# Patient Record
Sex: Female | Born: 1965 | Race: White | Hispanic: No | Marital: Married | Smoking: Never smoker
Health system: Southern US, Community
[De-identification: ages and names within clinical notes are randomized; demographics above are authoritative.]

---

## 2007-07-06 NOTE — H&P (Signed)
[facility]                              HISTORY AND PHYSICAL                             Morgandale W. Garlan Rush, [name]   NAMECHABLIS, LOSH [facility]   MR #:    [id number]                  ADM DATE:          07/10/2007   BILLING  [id number]                 PT. LOCATION   #:   SS #     [id number]               DOB:  1965/11/03   AGE:  41   Sandra Rush, [name]             SEX:  F   cc:    [name], [name]   CHIEF COMPLAINT:    Enlarged uterus.  Pelvic mass.   HISTORY OF THE PRESENT ILLNESS:  This patient is a 41 year old gravida 3,   para 2-0-1-2 woman whose last menstrual period was approximately 07/05/07.   She is self-referred with an enlarged uterus and a pelvic mass.  She   presented with heavy menses.  She was found to have a mass on abdominal   examination.  This was confirmed on transvaginal ultrasound which was   performed on 06/12/07.  This revealed the uterus to measure 11 x 7 x 8 cm.   Adjacent to the right ovary is a large heterogeneous solid mass measuring 6   x 9 cm.  This mass appears to be contiguous with the uterus.  A pelvic MRI   on 06/21/07 revealed the left ovary to measure 3.4 x 3 x 1.9 cm with a 2.1   x 1.9 cm cyst.  The right ovary measured 2 x 2.3 x 3.7 cm.  The uterus   measured 12.8 x 15.7 x 7.5 cm and contained multiple fibroids.  The largest   mass was noted to be exophytic measuring 7 x 9 x 7.3 cm.  This appeared to   be attached to the uterus by a 4.9 cm attachment.  The mass was noted to be   heterogenous and thought to be consistent with cystic degeneration.   PAST MEDICAL HISTORY:  The patient denies significant medical problems,   including cardiovascular, respiratory, renal, hepatic or endocrine   disorders.  She does give a history of abnormal Pap smears.  Her most   recent Pap smear in August 2008 was negative.   PAST SURGICAL HISTORY:  Cesarean section x 2.   CURRENT MEDICATIONS:   Vitamins and herbs.   ALLERGIES:    None known.    FAMILY HISTORY:  Significant for cardiovascular disease, diabetes and   hypertension.  Family history is negative for cancer.   SOCIAL HISTORY:  The patient is married and lives in [place], [place]   [place].  She works for [employer], an Scientist, forensic.  She does not smoke.   REVIEW OF SYSTEMS:  She has occasional nocturia.  She wears a pad for   occasional urinary incontinence. She has occasional constipation.   Otherwise, review of systems is noncontributory.  PHYSICAL EXAMINATION:   HEENT:  Grossly normal.   NECK:  Supple with normal thyroid.   LYMPH NODES:  No supraclavicular, axillary or inguinal adenopathy.   CHEST:  Clear to auscultation.   CARDIOVASCULAR:  Normal sinus rhythm.   ABDOMEN:  There is a mass in the lower quadrants of the abdomen extending   higher on the right side, nearly to the level of the umbilicus.  The mass   appears to be firm.   PELVIC:  Normal external genitalia.  The vagina is without obvious lesions.   The cervix is nulliparous.  Bimanual and rectovaginal examination reveals a   16 to 18-week size mass in the pelvis extending superiorly, more on the   right than the left.  No cul-de-sac nodularity.   EXTREMITIES:  No edema.   NEUROLOGIC:  No focal defects.   ASSESSMENT:   1. Enlarged uterus with multiple leiomyomata.   2. 6 x 9 cm heterogenous right pelvic mass.  The differential diagnosis      includes a degenerating uterine fibroid, broad ligament tumor or ovarian      tumor.   3. Status post cesarean section x 2.   PLAN:  Exploratory laparotomy with total abdominal hysterectomy and   resection of the right pelvic mass.  The patient wishes to retain her   ovaries if possible.  She understands that should she have a malignancy of   the uterus or ovaries, the ovaries would need to be resected.  In addition,   she understands the potential risks and complications of surgery to include   the possibility of bleeding, infection, blood clots, damage to normal    organs, medical or anesthesia complications, wound complications and even   death.  Furthermore, she understands she may require more treatment should   she have a malignancy.   Electronically Signed By:   Acie Fredrickson. Garlan Rush, [name] 07/20/2007 09:10   ____________________________   Acie Fredrickson. Garlan Rush, [name]   Marton Redwood  D:  07/06/2007  T:  07/06/2007  8:38 A   [id number]

## 2007-07-10 NOTE — Op Note (Signed)
[facility]                                OPERATION REPORT                        SURGEON:  Sandra Fredrickson. Garlan Fair, [name]   Hector Brunswick, [facility]   E:   MR  [id number]                DATE OF SURGERY:   #:   SS  [id number]             PT. LOCATION:                        [id number]   #   Sandra Fredrickson. Garlan Fair, [name]       DOB: September 15, 1966        AGE:41        SEX:  F   cc:    Sandra Rush, [name]   PREOPERATIVE DIAGNOSIS:   Complex pelvic mass with elevated serum CA-125.  Rule out ovarian cancer.   PREOPERATIVE DIAGNOSIS:   Uterine leiomyomata with degenerating leiomyoma.   OPERATIVE PROCEDURES:   1. Exploratory laparotomy.   2. Total abdominal hysterectomy.   SURGEON:   Jodi Geralds, [name]   ANESTHESIA:   General.   INDICATION FOR SURGERY:   The patient is a 41 year old referred with a large complex pelvic mass and   an elevated serum CA-125.  She desired her ovaries to be retained should   there be no evidence of malignancy.  She and her husband understood   preoperatively that in the face of abnormal ovaries, there is a 1% to 2%   chance of ovarian cancer in the future and 7% to 10% chance of requiring   surgery for ovarian problems in the future.  She decided to retain her   ovaries should they be within normal limits.  See dictated history and   physical.   OPERATIVE FINDINGS:   Bloody ascites, 150 mL.  Upper abdominal exploration within normal limits.   Gallbladder slightly distended.  Multiple leiomyomata.  Ovaries and tubes   grossly normal.  Intraoperative assessment by pathology revealed multiple   leiomyomata.  The largest leiomyoma was consistent with a degenerating   leiomyoma.  There was no evidence of malignancy.   OPERATIVE PROCEDURE:  Dr. Jani Gravel performed abdominoplasty under the   same anesthesia.  He will dictate his portion of the surgery separately,   which will include incising the skin and subcutaneous tissues and closure   of the skin and subcutaneous tissues.   The fascia was incised from  the symphysis to the umbilicus.  This incision   was extended to the left and superiorly for approximately 3 cm to 4 cm.   The peritoneal cavity was entered sharply.  Approximately 150 mL of bloody   ascites was encountered and removed.  This was discarded once the diagnosis   of malignancy was excluded.   A Gibson-Balfour retractor was placed.  The upper abdomen was explored with   the above findings.  Exposure was achieved using large Deavers held by   [employer].   The uterus was grasped to each cornual surface with a large Pean.  The left   round ligament was suture ligated and divided with cautery.  The posterior   peritoneum  was incised with cautery and the retroperitoneal space was   opened with blunt dissection.  The ureter was identified.  The uteroovarian   ligament was then doubly clamped, divided, and ligated with 0 Vicryl ties.   On the right, the right round ligament was suture ligated and divided with   cautery.  The posterior peritoneum overlying the psoas muscles was incised   and the retroperitoneal space opened with blunt dissection.  The ureter was   identified.  The uteroovarian ligament was doubly clamped, divided, and   ligated with 0 Vicryl ties.  A second pedicle was created in the same   fashion.  It should be noted that both ovaries and tubes were grossly   within normal limits.   The anterior leaf of the broad ligament was incised and the bladder   dissected from the lower uterine segment, cervix, and upper vagina.  The   serial pedicles were then created bilaterally incorporating the uterine   vessels, cardinal ligaments, uterosacral ligaments, paracervical, and   paravaginal tissues.  These pedicles were created with Heaney clamps.  The   pedicles were divided and ligated with 0 Vicryl sutures.  The specimen   including the cervix and uterus was resected from the upper vagina and   given to pathology for intraoperative evaluation.  Pathology assessment was   that of no evidence of  malignancy.  The large leiomyoma extending from the   right lateral fundal area was consistent with a degenerating leiomyoma.   Vaginal angle sutures were placed in a figure-of-eight fashion bilaterally   with 0 Vicryl and held for traction.  The vagina was closed with   interrupted figure-of-eight sutures of 0 Vicryl.  The pelvis and abdomen   were then irrigated with copious amounts of warm water and hemostasis   assured.   EndoSheath was placed to cover the posterior peritoneal incisions in the   pelvis bilaterally in attempts to prevent future adhesions of the adnexa.   The abdomen was then closed with a running #1 loop PDS suture.   Dr. Henrene Hawking will dictate closure of the skin in his abdominoplasty operative   report.   The patient tolerated the procedure well and returned to the recovery room   in excellent condition.   ESTIMATED BLOOD LOSS:   Less than 100 mL.   COMPLICATIONS:   None.   Sponge, needle, and instrument counts were correct x3.   Electronically Signed By:   Sandra Fredrickson. Garlan Fair, [name] 07/20/2007 09:10   _________________________________   Sandra Rush, [name]   TS1  D:  07/10/2007  T:  07/11/2007  9:57 A   [id number]

## 2007-07-10 NOTE — Op Note (Signed)
[facility]                                OPERATION REPORT                         SURGEON:  Laneta Simmers, [name]   [facility] Sandra Rush, Sandra Rush   E:   MR  [id number]                DATE OF SURGERY:                     07/10/2007   #:   Sandra Rush  [id number]             PT. LOCATION:                        [id number]   #   Laneta Simmers, [name]         DOB: [birth year]        AGE:41        SEX:  F   cc:    Sandra Fredrickson. MONTAG, [name]          Laneta Simmers, [name]   PREOPERATIVE DIAGNOSIS:   Abdominal panniculus   POSTOPERATIVE DIAGNOSIS:   Abdominal panniculus   PROCEDURE:   Abdominoplasty with liposuction of the abdomen   SURGEON:   Dr. Dimas Millin   ASSISTANT:   Arline Asp   ANESTHESIA:   General   DRAINS:   Two J-vacs   COMPLICATIONS:   None   INDICATIONS:   The patient is a 41 year old female with an abdominal panniculus and   multiple abdominal striae.  She has midabdominal lipodystrophy and also a   history of uterine fibroids requiring  hysterectomy.  Plans were made for   simultaneous abdominal hysterectomy by Dr. Garlan Fair and abdominoplasty.   DESCRIPTION OF PROCEDURE:  The patient was taken to the operating room and   placed in the supine position.  After general anesthesia was begun, she was   prepped and draped in a sterile manner.  A hip-to-hip incision was made   just below her Pfannenstiel scar in the pubic area with a 10 scalpel.  The   abdomen was infiltrated with approximately 700 cc of Klein solution.   Liposuction of the central portion of the abdomen was performed with a   triple-lumen 4-mm cannula, and approximately 400 cc of fatty material was   removed.   A skin flap was then elevated from the pubic incision to the costal margins   and xiphoid centrally.  Because of a lack of superior abdominal excessive   skin and a high-placed umbilicus, plans were made for floating the   umbilicus.  This was performed by transcut through the base of the   umbilicus at the abdominal wall which was later to be reinserted.  An    opening was made in the base of the umbilicus which was repaired with 3-0   Monocryl subcutaneous suture and 6-0 nylon skin suture inferiorly.   The hysterectomy was performed by Dr. Garlan Fair, and subsequently the rectus   fascia was re-plicated in the midline with a running 0 Ethibond locked   suture from the upper third of the abdomen to the pubic area.  Once this   was secured, the patient was placed in a flexed position and the excess  skin flap excised with a 10 scalpel and the cutting cautery.   Reapproximation of the umbilicus to the midline was performed with   interrupted 3-0 Monocryl suture.  The flap was plicated in the midline with   3-0 Monocryl suture and 4-0 nylon suture.  Two #10 flat J-vac drains were   introduced laterally.  The right one was placed above the umbilicus and the   left below.  Both were held in place with interrupted 4-0 nylon sutures.   After the excess skin fat flap was excised and hemostasis performed with   electrocautery, subcutaneous reapproximation of the flap to the lower   incision line was performed with interrupted 3-0 Monocryl sutures and a   running 3-0 Prolene subcuticular suture.  The wound was dressed with   benzoin, Steri-Strips, Xeroform, 4 x 4s, ABD and abdominal binder.   Estimated blood loss was 100 cc for the abdominoplasty.  Specimen was not   sent to pathology as it was normal skin and fat.  The patient tolerated the   procedure well and was taken to the recovery room in satisfactory   condition.   Electronically Signed By:   Laneta Simmers, [name] 07/18/2007 08:27   _________________________________   Laneta Simmers, [name]   [facility]  D:  07/10/2007  T:  07/11/2007  9:58 A   [id number]

## 2007-07-10 NOTE — Op Note (Signed)
[facility]                                OPERATION REPORT                        SURGEON:  Acie Fredrickson. Garlan Fair, [name]   Hector Brunswick, [facility]   E:   MR  [id number]                DATE OF SURGERY:   #:   SS  [id number]             PT. LOCATION:                        [id number]   #   Acie Fredrickson. Garlan Fair, [name]       DOB: 26-Apr-1966        AGE:41        SEX:  F   cc:    [name], [name]   PREOPERATIVE DIAGNOSIS:   Complex pelvic mass with elevated serum CA-125.  Rule out ovarian cancer.   PREOPERATIVE DIAGNOSIS:   Uterine leiomyomata with degenerating leiomyoma.   OPERATIVE PROCEDURES:   1. Exploratory laparotomy.   2. Total abdominal hysterectomy.   SURGEON:   Jodi Geralds, [name]   ANESTHESIA:   General.   INDICATION FOR SURGERY:   The patient is a 41 year old referred with a large complex pelvic mass and   an elevated serum CA-125.  She desired her ovaries to be retained should   there be no evidence of malignancy.  She and her husband understood   preoperatively that in the face of abnormal ovaries, there is a 1% to 2%   chance of ovarian cancer in the future and 7% to 10% chance of requiring   surgery for ovarian problems in the future.  She decided to retain her   ovaries should they be within normal limits.  See dictated history and   physical.   OPERATIVE FINDINGS:   Bloody ascites, 150 mL.  Upper abdominal exploration within normal limits.   Gallbladder slightly distended.  Multiple leiomyomata.  Ovaries and tubes   grossly normal.  Intraoperative assessment by pathology revealed multiple   leiomyomata.  The largest leiomyoma was consistent with a degenerating   leiomyoma.  There was no evidence of malignancy.   OPERATIVE PROCEDURE:  Dr. Jani Gravel performed abdominoplasty under the   same anesthesia.  He will dictate his portion of the surgery separately,   which will include incising the skin and subcutaneous tissues and closure   of the skin and subcutaneous tissues.    The fascia was incised from the symphysis to the umbilicus.  This incision   was extended to the left and superiorly for approximately 3 cm to 4 cm.   The peritoneal cavity was entered sharply.  Approximately 150 mL of bloody   ascites was encountered and removed.  This was discarded once the diagnosis   of malignancy was excluded.   A Gibson-Balfour retractor was placed.  The upper abdomen was explored with   the above findings.  Exposure was achieved using large Deavers held by   [employer].   The uterus was grasped to each cornual surface with a large Pean.  The left   round ligament was suture ligated and divided with cautery.  The posterior   peritoneum  was incised with cautery and the retroperitoneal space was   opened with blunt dissection.  The ureter was identified.  The uteroovarian   ligament was then doubly clamped, divided, and ligated with 0 Vicryl ties.   On the right, the right round ligament was suture ligated and divided with   cautery.  The posterior peritoneum overlying the psoas muscles was incised   and the retroperitoneal space opened with blunt dissection.  The ureter was   identified.  The uteroovarian ligament was doubly clamped, divided, and   ligated with 0 Vicryl ties.  A second pedicle was created in the same   fashion.  It should be noted that both ovaries and tubes were grossly   within normal limits.   The anterior leaf of the broad ligament was incised and the bladder   dissected from the lower uterine segment, cervix, and upper vagina.  The   serial pedicles were then created bilaterally incorporating the uterine   vessels, cardinal ligaments, uterosacral ligaments, paracervical, and   paravaginal tissues.  These pedicles were created with Heaney clamps.  The   pedicles were divided and ligated with 0 Vicryl sutures.  The specimen   including the cervix and uterus was resected from the upper vagina and   given to pathology for intraoperative evaluation.  Pathology assessment was    that of no evidence of malignancy.  The large leiomyoma extending from the   right lateral fundal area was consistent with a degenerating leiomyoma.   Vaginal angle sutures were placed in a figure-of-eight fashion bilaterally   with 0 Vicryl and held for traction.  The vagina was closed with   interrupted figure-of-eight sutures of 0 Vicryl.  The pelvis and abdomen   were then irrigated with copious amounts of warm water and hemostasis   assured.   EndoSheath was placed to cover the posterior peritoneal incisions in the   pelvis bilaterally in attempts to prevent future adhesions of the adnexa.   The abdomen was then closed with a running #1 loop PDS suture.   Dr. Henrene Hawking will dictate closure of the skin in his abdominoplasty operative   report.   The patient tolerated the procedure well and returned to the recovery room   in excellent condition.   ESTIMATED BLOOD LOSS:   Less than 100 mL.   COMPLICATIONS:   None.   Sponge, needle, and instrument counts were correct x3.   Electronically Signed By:   Acie Fredrickson. Garlan Fair, [name] 07/20/2007 09:10   _________________________________   [name], [name]   TS1  D:  07/10/2007  T:  07/11/2007  9:57 A   [id number]

## 2007-07-10 NOTE — Op Note (Signed)
[facility]                                OPERATION REPORT                         SURGEON:  FRED H. SIEGEL, [name]   Sandra Rush, Sebrina   E:   MR  [id number]                DATE OF SURGERY:                     07/10/2007   #:   SS  [id number]             PT. LOCATION:                        [id number]   #   FRED H. SIEGEL, [name]         DOB: 01/09/1966        AGE:41        SEX:  F   cc:    THOMAS W. MONTAG, [name]          FRED H. SIEGEL, [name]   PREOPERATIVE DIAGNOSIS:   Abdominal panniculus   POSTOPERATIVE DIAGNOSIS:   Abdominal panniculus   PROCEDURE:   Abdominoplasty with liposuction of the abdomen   SURGEON:   Dr. F. Siegel   ASSISTANT:   Cindy   ANESTHESIA:   General   DRAINS:   Two J-vacs   COMPLICATIONS:   None   INDICATIONS:   The patient is a 41-year-old female with an abdominal panniculus and   multiple abdominal striae.  She has midabdominal lipodystrophy and also a   history of uterine fibroids requiring  hysterectomy.  Plans were made for   simultaneous abdominal hysterectomy by Dr. Montag and abdominoplasty.   DESCRIPTION OF PROCEDURE:  The patient was taken to the operating room and   placed in the supine position.  After general anesthesia was begun, she was   prepped and draped in a sterile manner.  A hip-to-hip incision was made   just below her Pfannenstiel scar in the pubic area with a 10 scalpel.  The   abdomen was infiltrated with approximately 700 cc of Klein solution.   Liposuction of the central portion of the abdomen was performed with a   triple-lumen 4-mm cannula, and approximately 400 cc of fatty material was   removed.   A skin flap was then elevated from the pubic incision to the costal margins   and xiphoid centrally.  Because of a lack of superior abdominal excessive   skin and a high-placed umbilicus, plans were made for floating the   umbilicus.  This was performed by transcut through the base of the   umbilicus at the abdominal wall which was later to be reinserted.  An    opening was made in the base of the umbilicus which was repaired with 3-0   Monocryl subcutaneous suture and 6-0 nylon skin suture inferiorly.   The hysterectomy was performed by Dr. Montag, and subsequently the rectus   fascia was re-plicated in the midline with a running 0 Ethibond locked   suture from the upper third of the abdomen to the pubic area.  Once this   was secured, the patient was placed in a flexed position and the excess     skin flap excised with a 10 scalpel and the cutting cautery.   Reapproximation of the umbilicus to the midline was performed with   interrupted 3-0 Monocryl suture.  The flap was plicated in the midline with   3-0 Monocryl suture and 4-0 nylon suture.  Two #10 flat J-vac drains were   introduced laterally.  The right one was placed above the umbilicus and the   left below.  Both were held in place with interrupted 4-0 nylon sutures.   After the excess skin fat flap was excised and hemostasis performed with   electrocautery, subcutaneous reapproximation of the flap to the lower   incision line was performed with interrupted 3-0 Monocryl sutures and a   running 3-0 Prolene subcuticular suture.  The wound was dressed with   benzoin, Steri-Strips, Xeroform, 4 x 4s, ABD and abdominal binder.   Estimated blood loss was 100 cc for the abdominoplasty.  Specimen was not   sent to pathology as it was normal skin and fat.  The patient tolerated the   procedure well and was taken to the recovery room in satisfactory   condition.   Electronically Signed By:   FRED H. SIEGEL, [name] 07/18/2007 08:27   _________________________________   FRED H. SIEGEL, [name]   [facility]  D:  07/10/2007  T:  07/11/2007  9:58 A   [id number]

## 2007-07-15 NOTE — Discharge Summary (Signed)
[facility]                                DISCHARGE SUMMARY   Sandra Rush, Sandra Rush   MR #:  [id number]                      ADM DATE:   07/10/2007   BILLIN [id number]                     DIS DATE:     07/15/2007   G#   SS #   [id number]                   DOB:  1965-12-21   Sandra Rush, [name]               AGE:41                   SEX:  F   cc:    Sandra Rush, [name]   ADMISSION DIAGNOSIS:      1. Enlarged uterus.      2. Pelvic mass.   DISCHARGE DIAGNOSIS:  Uterine leiomyomata.   OPERATIVE PROCEDURES: 07/10/2007:      1. Exploratory laparotomy.      2. Total abdominal hysterectomy.      3. Abdominoplasty (Dr. Henrene Rush).   PATHOLOGY:      1. Uterine leiomyomata.      2. Large leiomyomata with increased mitotic activity.  No atypia or      increased cellularity.   INDICATIONS FOR ADMISSION AND SURGERY:  The patient is a 41 year old   gravida 3, para 2012 woman self referred with an enlarged uterus and a   pelvic mass.  She presented with heavy menses.  She was found to have a   mass on abdominal examination.  This was confirmed on transvaginal   ultrasound.  See dictated history and physical for further details.   HOSPITAL COURSE:  The patient underwent the above surgical procedures on   07/10/2007 under general anesthesia without complications.   Intraoperatively, 150 cc of bloody ascitic fluid was removed.  The uterus   was enlarged.  Intraoperative pathology assessment was consistent with a   degenerating leiomyoma.  Pathology as noted above.   POSTOPERATIVE COURSE:  The patient's postoperative course was uncomplicated   with the exception of a mild ileus.  Once bowel function returned, her diet   was advanced.  At the time of discharge she had normal bowel function and   was tolerating a regular diet.   DISPOSITION:  The patient will return to the office in 2 weeks for a follow   up.   DISCHARGE DIET:  Regular.   DISCHARGE ACTIVITY:  Ad- lib.   DISCHARGE MEDICATIONS:       1. Oxycodone 5 mg p.o. q.4 hours p.r.n.      2. She will resume the vitamins and herbal medication she was taking      prior to admission.   Electronically Signed By:   Sandra Rush, [name] 07/20/2007 09:10   ________________________________   Sandra Rush, [name]   TS1  D:  07/15/2007  T:  07/15/2007  1:23 P   [id number]

## 2015-01-31 ENCOUNTER — Ambulatory Visit
Admit: 2015-01-31 | Disposition: A | Discharge status: Home / Self Care | Payer: Self-pay | Attending: Family Medicine | Admitting: Family Medicine

## 2015-01-31 LAB — URINALYSIS, COMPLETE
Bilirubin,UR: NEGATIVE
Blood: NEGATIVE
Glucose,UR: 500
Nitrite: NEGATIVE
PH: 5.5 (ref 5.0–8.0)
Protein: 30
Specific Gravity: 1.025 (ref 1.000–1.030)
WBC UR: >30 /HPF (ref 0–5)

## 2015-01-31 LAB — GC/CHLAMYDIA PROBE AMP

## 2015-01-31 LAB — WET PREP, GENITAL

## 2015-02-02 LAB — URINE CULTURE

## 2015-02-11 ENCOUNTER — Ambulatory Visit: Admit: 2015-02-11 | Disposition: A | Discharge status: Home / Self Care | Payer: Self-pay

## 2015-06-13 DIAGNOSIS — S40869A Insect bite (nonvenomous) of unspecified upper arm, initial encounter: Secondary | ICD-10-CM

## 2015-06-13 NOTE — ED Provider Notes (Signed)
[facility]  EMERGENCY DEPARTMENT TREATMENT REPORT  NAME:  Sandra Rush, Sandra Rush  SEX:   F  ADMIT: 06/13/2015  DOB:   10-Dec-1965  MR#    [id number]  ROOM:  ER09  TIME DICTATED: 12 14 AM  ACCT#  [id number]        DATE OF SERVICE:   06/13/2015.     CHIEF COMPLAINT:  Insect bite.    HISTORY OF PRESENT ILLNESS:  This is a 49 year old female who thinks maybe she was bit by bedbugs.  She has   multiple insect bites over her body which are itchy.  She has no fevers, no   swelling, no difficulty breathing, no nausea or vomiting.    REVIEW OF SYSTEMS:  CONSTITUTIONAL:  No fever, chills.  EYES:   No visual symptoms.   ENT:  No sore throat, runny nose, or other URI symptoms.   HEMATOLOGIC/LYMPHATIC:   No excessive bruising or lymph node swelling.   ALLERGIC/IMMUNOLOGIC:  She has a rash over her body which is itchy.  RESPIRATORY:  No cough, shortness of breath, or wheezing.   CARDIOVASCULAR:  No chest pain, chest pressure, or palpitations.   GASTROINTESTINAL:  No vomiting, diarrhea, or abdominal pain.   MUSCULOSKELETAL:  No joint pain or swelling.   INTEGUMENTARY:  She has a rash but no other lesions.  NEUROLOGICAL:  No headaches.    PAST MEDICAL HISTORY:  Negative.    PAST SURGICAL HISTORY:  Fibroids.    PSYCHIATRIC HISTORY:  Anxiety.    SOCIAL HISTORY:  She denies alcohol, tobacco or illicit drug use.    CURRENT MEDICATIONS:   None.    ALLERGIES:  GLUTEN, WHEAT.    PHYSICAL EXAMINATION:  VITAL SIGNS:  Blood pressure 190/95, pulse 77, respirations 16, temperature   98.7.  GENERAL APPEARANCE:  Patient appears well developed and well nourished.    Appearance and behavior are age and situation appropriate.  She is well   appearing in no distress.  HEENT:  Head is atraumatic normocephalic.  Eyes:  Conjunctivae clear, lids   normal.  Pupils equal, symmetrical, and normally reactive.  Ears/Nose:    Hearing is grossly intact to voice.  Internal and external examinations of the    ears and nose are unremarkable.  Mouth/Throat:  Surfaces of the pharynx,   palate, and tongue are pink, moist, and without lesions.  There is no lip or   tongue swelling.  Oropharynx is unremarkable.  Her uvula is midline without   any swelling.  NECK:  Supple, nontender.  There is no stridor.   LYMPHATICS:  No cervical or submandibular lymphadenopathy palpated.   RESPIRATORY:  Clear and equal breath sounds.  No respiratory distress,   tachypnea, or accessory muscle use. No wheezing.  CARDIOVASCULAR:   Heart regular, without murmurs, gallops, rubs, or thrills.    SKIN:  She has a diffuse rash on her trunk and arms which appears to be   consistent with multiple insect bites.   MUSCULOSKELETAL: Stance and gait appear normal.   NEUROLOGIC:  Alert, oriented.  Sensation intact, motor strength equal and   symmetric.     INITIAL ASSESSMENT:  Multiple insect bites.  At this time there is no evidence of any significant   anaphylaxis.  She does seem to be having mild allergic reaction.  I am   treating her symptomatically and discharging her home with appropriate   outpatient management.    CLINICAL IMPRESSION:  1.  Multiple insect bites.  2.  Allergic  reaction.    DISPOSITION:     The patient is discharged with verbal and written instructions and a referral   for ongoing care.  The patient is aware that they may return at any time for   new or worsening symptoms.      ___________________  Johny Drillingodd A Cia Garretson MD  Dictated By: Marland Kitchen.     My signature above authenticates this document and my orders, the final   diagnosis (es), discharge prescription (s), and instructions in the Epic   record.  If you have any questions please contact [phone].    Nursing notes have been reviewed by the physician/ advanced practice   clinician.    DS  D:06/17/2015 00:14:43  T: 06/17/2015 01:51:19  [id number]

## 2015-06-13 NOTE — Other (Signed)
10:31 PM  06/13/15     Discharge instructions given to Sandra Rush  (name) with verbalization of understanding. Patient accompanied by self.  Patient discharged with the following prescriptions prednisone, atarax. Epi pen  Patient discharged to home (destination).      Driscilla Grammes, RN

## 2015-06-13 NOTE — ED Triage Notes (Signed)
Multiple areas of insect bites all over body

## 2015-06-14 ENCOUNTER — Inpatient Hospital Stay
Admit: 2015-06-14 | Discharge: 2015-06-14 | Disposition: A | Discharge status: Home / Self Care | Payer: PRIVATE HEALTH INSURANCE | Attending: Emergency Medicine

## 2015-06-14 MED ORDER — HYDROXYZINE 25 MG TAB
25 mg | ORAL_TABLET | Freq: Three times a day (TID) | ORAL | 0 refills | Status: AC | PRN
Start: 2015-06-14 — End: 2015-06-23

## 2015-06-14 MED ORDER — PREDNISONE 20 MG TAB
20 mg | ORAL_TABLET | ORAL | 0 refills | Status: AC
Start: 2015-06-14 — End: ?

## 2015-06-14 MED ORDER — EPINEPHRINE 0.3 MG/0.3 ML IM PEN INJECTOR
0.3 mg/ mL | INJECTION | Freq: Once | INTRAMUSCULAR | 1 refills | Status: AC | PRN
Start: 2015-06-14 — End: ?

## 2015-06-27 ENCOUNTER — Emergency Department: Payer: PRIVATE HEALTH INSURANCE

## 2015-06-27 ENCOUNTER — Other Ambulatory Visit: Payer: Self-pay

## 2015-06-27 ENCOUNTER — Emergency Department
Admission: EM | Admit: 2015-06-27 | Discharge: 2015-06-27 | Disposition: A | Discharge status: Home / Self Care | Payer: PRIVATE HEALTH INSURANCE | Attending: Emergency Medicine | Admitting: Emergency Medicine

## 2015-06-27 ENCOUNTER — Encounter: Payer: Self-pay | Admitting: Emergency Medicine

## 2015-06-27 DIAGNOSIS — R739 Hyperglycemia, unspecified: Secondary | ICD-10-CM | POA: Insufficient documentation

## 2015-06-27 DIAGNOSIS — R1010 Upper abdominal pain, unspecified: Secondary | ICD-10-CM | POA: Diagnosis present

## 2015-06-27 DIAGNOSIS — R1013 Epigastric pain: Secondary | ICD-10-CM | POA: Diagnosis not present

## 2015-06-27 DIAGNOSIS — R1110 Vomiting, unspecified: Secondary | ICD-10-CM | POA: Insufficient documentation

## 2015-06-27 LAB — CBC
HEMATOCRIT: 41.9 % (ref 35.0–47.0)
Hemoglobin: 14.5 g/dL (ref 12.0–16.0)
MCH: 29.6 pg (ref 26.0–34.0)
MCHC: 34.7 g/dL (ref 32.0–36.0)
MCV: 85.4 fL (ref 80.0–100.0)
Platelets: 227 10*3/uL (ref 150–440)
RBC: 4.9 MIL/uL (ref 3.80–5.20)
RDW: 12.9 % (ref 11.5–14.5)
WBC: 6.7 10*3/uL (ref 3.6–11.0)

## 2015-06-27 LAB — URINALYSIS COMPLETE WITH MICROSCOPIC (ARMC ONLY)
Bilirubin Urine: NEGATIVE
Glucose, UA: >500 mg/dL — AB
Hgb urine dipstick: NEGATIVE
Leukocytes, UA: NEGATIVE
NITRITE: NEGATIVE
Protein, ur: NEGATIVE mg/dL
Specific Gravity, Urine: 1.046 — ABNORMAL HIGH (ref 1.005–1.030)
pH: 5 (ref 5.0–8.0)

## 2015-06-27 LAB — COMPREHENSIVE METABOLIC PANEL
ALK PHOS: 71 U/L (ref 38–126)
ALT: 35 U/L (ref 14–54)
AST: 27 U/L (ref 15–41)
Albumin: 4.5 g/dL (ref 3.5–5.0)
Anion gap: 9 (ref 5–15)
BILIRUBIN TOTAL: 0.9 mg/dL (ref 0.3–1.2)
BUN: 17 mg/dL (ref 6–20)
CO2: 29 mmol/L (ref 22–32)
Calcium: 9.5 mg/dL (ref 8.9–10.3)
Chloride: 98 mmol/L — ABNORMAL LOW (ref 101–111)
Creatinine, Ser: 0.67 mg/dL (ref 0.44–1.00)
GFR calc Af Amer: >60 mL/min (ref >60)
GFR calc non Af Amer: >60 mL/min (ref >60)
Glucose, Bld: 373 mg/dL — ABNORMAL HIGH (ref 65–99)
POTASSIUM: 3.3 mmol/L — AB (ref 3.5–5.1)
Sodium: 136 mmol/L (ref 135–145)
TOTAL PROTEIN: 8 g/dL (ref 6.5–8.1)

## 2015-06-27 LAB — LIPASE, BLOOD: Lipase: 23 U/L (ref 22–51)

## 2015-06-27 MED ORDER — METFORMIN HCL 500 MG PO TABS: 500.0000 mg | ORAL_TABLET | Freq: Every day | ORAL | Status: AC

## 2015-06-27 MED ORDER — RANITIDINE HCL 150 MG PO TABS: 150.0000 mg | ORAL_TABLET | Freq: Every day | ORAL | Status: AC

## 2015-06-27 MED ORDER — SODIUM CHLORIDE 0.9 % IV SOLN
Freq: Once | INTRAVENOUS | Status: AC
Start: 2015-06-27 — End: 2015-06-27
  Administered 2015-06-27: 16:00:00 via INTRAVENOUS

## 2015-06-27 NOTE — ED Provider Notes (Signed)
[facility] Emergency Department Provider Note     Time seen: ----------------------------------------- 3:29 PM on 06/27/2015 -----------------------------------------    I have reviewed the triage vital signs and the nursing notes.   HISTORY  Chief Complaint Insect Bite and Abdominal Pain    HPI: Sheila Henry is a 49 y.o. female who presents ER for complications involving bedbugs, concern for a spider bite and abdominal pain. Patient states been having a upper abdominal pain for some time, she denies fevers chills or other complaints. Patient states she believes and holistic medicine, has been taking supplements for same.   History reviewed. No pertinent past medical history.  There are no active problems to display for this patient.   History reviewed. No pertinent past surgical history.  Allergies Review of patient's allergies indicates no known allergies.  Social History Social History  Substance Use Topics  . Smoking status: Never Smoker   . Smokeless tobacco: None  . Alcohol Use: No    Review of Systems Constitutional: Negative for fever. Eyes: Negative for visual changes. ENT: Negative for sore throat. Cardiovascular: Negative for chest pain. Respiratory: Negative for shortness of breath. Gastrointestinal: Positive for abdominal pain and vomiting Genitourinary: Negative for dysuria. Musculoskeletal: Negative for back pain. Skin: Positive for insect bites Neurological: Negative for headaches, focal weakness or numbness.  10-point ROS otherwise negative.  ____________________________________________   PHYSICAL EXAM:  VITAL SIGNS: ED Triage Vitals  Enc Vitals Group     BP 06/27/15 1321 210/119 mmHg     Pulse Rate 06/27/15 1321 95     Resp 06/27/15 1321 20     Temp 06/27/15 1321 98 F (36.7 C)     Temp Source 06/27/15 1321 Oral     SpO2 06/27/15 1321 100 %     Weight 06/27/15 1321 150 lb (68.04 kg)     Height  06/27/15 1321 4\' 11"  (1.499 m)     Head Cir --      Peak Flow --      Pain Score 06/27/15 1321 8     Pain Loc --      Pain Edu? --      Excl. in GC? --     Constitutional: Alert and oriented. Well appearing and in no distress. Eyes: Conjunctivae are normal. PERRL. Normal extraocular movements. ENT   Head: Normocephalic and atraumatic.   Nose: No congestion/rhinnorhea.   Mouth/Throat: Mucous membranes are moist.   Neck: No stridor. Cardiovascular: Normal rate, regular rhythm. Normal and symmetric distal pulses are present in all extremities. No murmurs, rubs, or gallops. Respiratory: Normal respiratory effort without tachypnea nor retractions. Breath sounds are clear and equal bilaterally. No wheezes/rales/rhonchi. Gastrointestinal: Soft and nontender. No distention. No abdominal bruits.  Musculoskeletal: Nontender with normal range of motion in all extremities. No joint effusions.  No lower extremity tenderness nor edema. Neurologic:  Normal speech and language. No gross focal neurologic deficits are appreciated. Speech is normal. No gait instability. Skin:  Small insect bite is noted over the dorsolateral aspect of the right hand Psychiatric: Somewhat bizarre mood and affect. ____________________________________________  EKG: Interpreted by me. Sinus rhythm with normal axis normal intervals. No evidence of hypertrophy or acute infarction. Rate is 90 bpm  ____________________________________________  ED COURSE:  Pertinent labs & imaging results that were available during my care of the patient were reviewed by me and considered in my medical decision making (see chart for details). Abdomen tender to assure the patient that her symptoms are not spider  related. 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 She will need saline, to this point she has been refusing  metformin. ____________________________________________    LABS (pertinent positives/negatives)  Labs Reviewed  COMPREHENSIVE METABOLIC PANEL - Abnormal; Notable for the following:    Potassium 3.3 (*)    Chloride 98 (*)    Glucose, Bld 373 (*)    All other components within normal limits  URINALYSIS COMPLETEWITH MICROSCOPIC ([facility] ONLY) - Abnormal; Notable for the following:    Color, Urine YELLOW (*)    APPearance CLEAR (*)    Glucose, UA >500 (*)    Ketones, ur 1+ (*)    Specific Gravity, Urine 1.046 (*)    Bacteria, UA RARE (*)    Squamous Epithelial / LPF 0-5 (*)    All other components within normal limits  LIPASE, BLOOD  CBC    RADIOLOGY Images were viewed by me  Abdomen 2 view IMPRESSION: 1. Normal bowel gas pattern, no free air. 2. Small area of opacity at the right lung base is nonspecific and might reflect scarring in the absence of acute pulmonary symptoms. If the patient is asymptomatic, follow up PA and lateral chest radiographs in 8-12 weeks to evaluate stability/persistence. ____________________________________________  FINAL ASSESSMENT AND PLAN  Hyperglycemia, dehydration, hypertension  Plan: Patient with labs and imaging as dictated above. Patient appears to have new onset diabetes. She has refused medication here. She is advised to have close follow-up with her doctor for reevaluation and blood sugar rechecked. Patient does not agree with my assessments here, she will not take any medications that I prescribed. She is leaving without any my treatments against my advice.   Emily Filbert, MD   Emily Filbert, MD 06/27/15 [phone]

## 2015-06-27 NOTE — ED Notes (Signed)
Patient to ED with report of spider bite that she noticed yesterday. Patient reports she is also having abdominal pain. Patient reports that she is very holistic and believes that the two are related.

## 2015-06-27 NOTE — Discharge Instructions (Signed)
Abdominal Pain °Many things can cause abdominal pain. Usually, abdominal pain is not caused by a disease and will improve without treatment. It can often be observed and treated at home. Your health care provider will do a physical exam and possibly order blood tests and X-rays to help determine the seriousness of your pain. However, in many cases, more time must pass before a clear cause of the pain can be found. Before that point, your health care provider may not know if you need more testing or further treatment. °HOME CARE INSTRUCTIONS  °Monitor your abdominal pain for any changes. The following actions may help to alleviate any discomfort you are experiencing: °· Only take over-the-counter or prescription medicines as directed by your health care provider. °· Do not take laxatives unless directed to do so by your health care provider. °· Try a clear liquid diet (broth, tea, or water) as directed by your health care provider. Slowly move to a bland diet as tolerated. °SEEK MEDICAL CARE IF: °· You have unexplained abdominal pain. °· You have abdominal pain associated with nausea or diarrhea. °· You have pain when you urinate or have a bowel movement. °· You experience abdominal pain that wakes you in the night. °· You have abdominal pain that is worsened or improved by eating food. °· You have abdominal pain that is worsened with eating fatty foods. °· You have a fever. °SEEK IMMEDIATE MEDICAL CARE IF:  °· Your pain does not go away within 2 hours. °· You keep throwing up (vomiting). °· Your pain is felt only in portions of the abdomen, such as the right side or the left lower portion of the abdomen. °· You pass bloody or black tarry stools. °MAKE SURE YOU: °· Understand these instructions.   °· Will watch your condition.   °· Will get help right away if you are not doing well or get worse.   °Document Released: 07/21/2005 Document Revised: 10/16/2013 Document Reviewed: 06/20/2013 °ExitCare® Patient Information  ©2015 [employer]. This information is not intended to replace advice given to you by your health care provider. Make sure you discuss any questions you have with your health care provider. ° °Hyperglycemia °Hyperglycemia occurs when the glucose (sugar) in your blood is too high. Hyperglycemia can happen for many reasons, but it most often happens to people who do not know they have diabetes or are not managing their diabetes properly.  °CAUSES  °Whether you have diabetes or not, there are other causes of hyperglycemia. Hyperglycemia can occur when you have diabetes, but it can also occur in other situations that you might not be as aware of, such as: °Diabetes °· If you have diabetes and are having problems controlling your blood glucose, hyperglycemia could occur because of some of the following reasons: °¨ Not following your meal plan. °¨ Not taking your diabetes medications or not taking it properly. °¨ Exercising less or doing less activity than you normally do. °¨ Being sick. °Pre-diabetes °· This cannot be ignored. Before people develop Type 2 diabetes, they almost always have "pre-diabetes." This is when your blood glucose levels are higher than normal, but not yet high enough to be diagnosed as diabetes. Research has shown that some long-term damage to the body, especially the heart and circulatory system, may already be occurring during pre-diabetes. If you take action to manage your blood glucose when you have pre-diabetes, you may delay or prevent Type 2 diabetes from developing. °Stress °· If you have diabetes, you may be "diet" controlled   or on oral medications or insulin to control your diabetes. However, you may find that your blood glucose is higher than usual in the hospital whether you have diabetes or not. This is often referred to as "stress hyperglycemia." Stress can elevate your blood glucose. This happens because of hormones put out by the body during times of stress. If stress has been the  cause of your high blood glucose, it can be followed regularly by your caregiver. That way he/she can make sure your hyperglycemia does not continue to get worse or progress to diabetes. °Steroids °· Steroids are medications that act on the infection fighting system (immune system) to block inflammation or infection. One side effect can be a rise in blood glucose. Most people can produce enough extra insulin to allow for this rise, but for those who cannot, steroids make blood glucose levels go even higher. It is not unusual for steroid treatments to "uncover" diabetes that is developing. It is not always possible to determine if the hyperglycemia will go away after the steroids are stopped. A special blood test called an A1c is sometimes done to determine if your blood glucose was elevated before the steroids were started. °SYMPTOMS °· Thirsty. °· Frequent urination. °· Dry mouth. °· Blurred vision. °· Tired or fatigue. °· Weakness. °· Sleepy. °· Tingling in feet or leg. °DIAGNOSIS  °Diagnosis is made by monitoring blood glucose in one or all of the following ways: °· A1c test. This is a chemical found in your blood. °· Fingerstick blood glucose monitoring. °· Laboratory results. °TREATMENT  °First, knowing the cause of the hyperglycemia is important before the hyperglycemia can be treated. Treatment may include, but is not be limited to: °· Education. °· Change or adjustment in medications. °· Change or adjustment in meal plan. °· Treatment for an illness, infection, etc. °· More frequent blood glucose monitoring. °· Change in exercise plan. °· Decreasing or stopping steroids. °· Lifestyle changes. °HOME CARE INSTRUCTIONS  °· Test your blood glucose as directed. °· Exercise regularly. Your caregiver will give you instructions about exercise. Pre-diabetes or diabetes which comes on with stress is helped by exercising. °· Eat wholesome, balanced meals. Eat often and at regular, fixed times. Your caregiver or  nutritionist will give you a meal plan to guide your sugar intake. °· Being at an ideal weight is important. If needed, losing as little as 10 to 15 pounds may help improve blood glucose levels. °SEEK MEDICAL CARE IF:  °· You have questions about medicine, activity, or diet. °· You continue to have symptoms (problems such as increased thirst, urination, or weight gain). °SEEK IMMEDIATE MEDICAL CARE IF:  °· You are vomiting or have diarrhea. °· Your breath smells fruity. °· You are breathing faster or slower. °· You are very sleepy or incoherent. °· You have numbness, tingling, or pain in your feet or hands. °· You have chest pain. °· Your symptoms get worse even though you have been following your caregiver's orders. °· If you have any other questions or concerns. °Document Released: 04/06/2001 Document Revised: 01/03/2012 Document Reviewed: 02/07/2012 °ExitCare® Patient Information ©2015 [employer]. This information is not intended to replace advice given to you by your health care provider. Make sure you discuss any questions you have with your health care provider. ° °

## 2016-02-02 IMAGING — US THYROID ULTRASOUND
1 series · 14 of 25 positions shown · non-contrast
Comparison: None.

CLINICAL DATA: Thyroid nodule

EXAM:
THYROID ULTRASOUND
TECHNIQUE: Ultrasound examination of the thyroid gland and adjacent soft
tissues was performed.

[Series 1: thyroid ultrasound · 0.06mm/px · 14 of 74 slices shown]
[im 1/74]
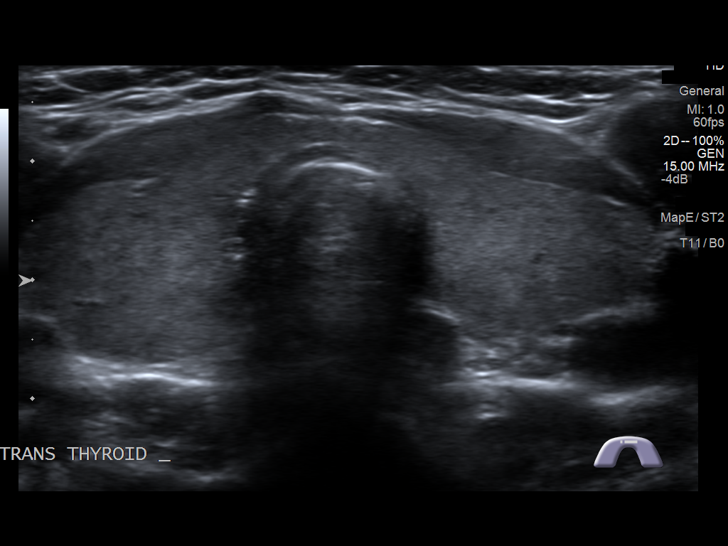
[im 7/74]
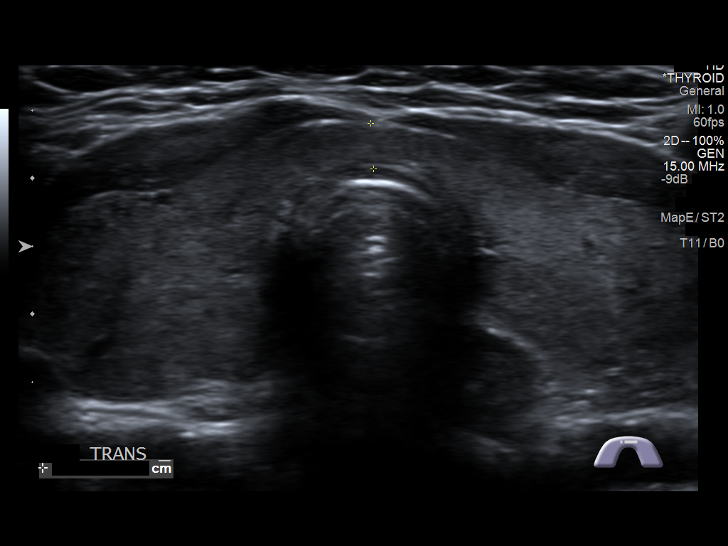
[im 13/74]
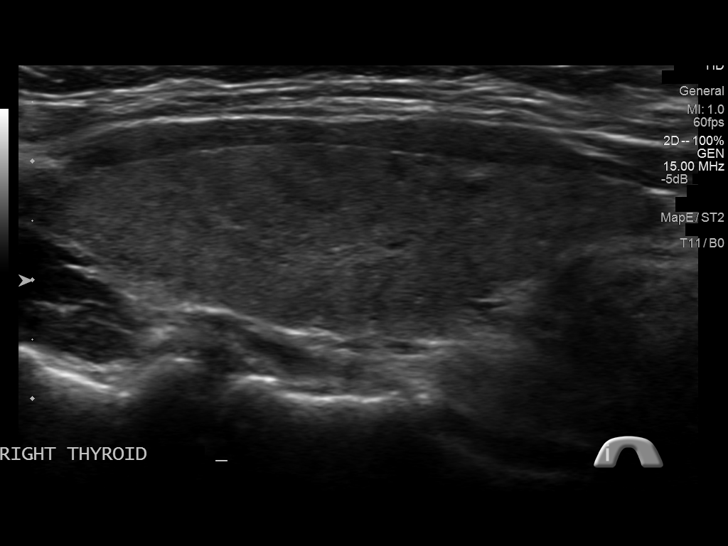
[im 19/74]
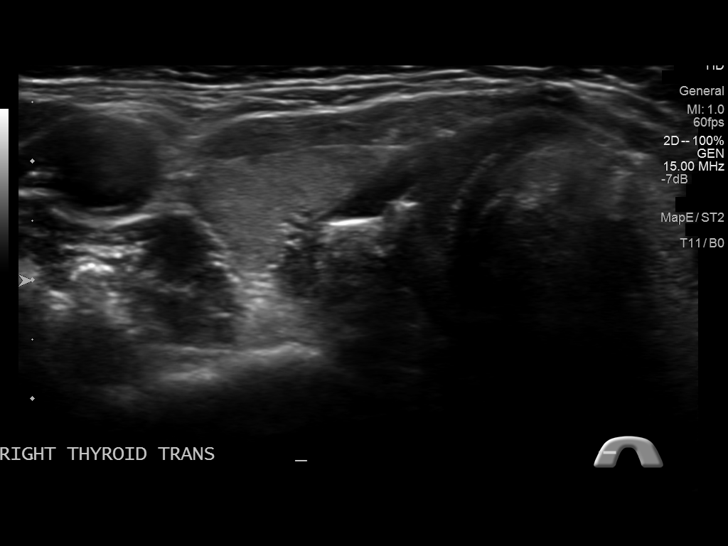
[im 25/74]
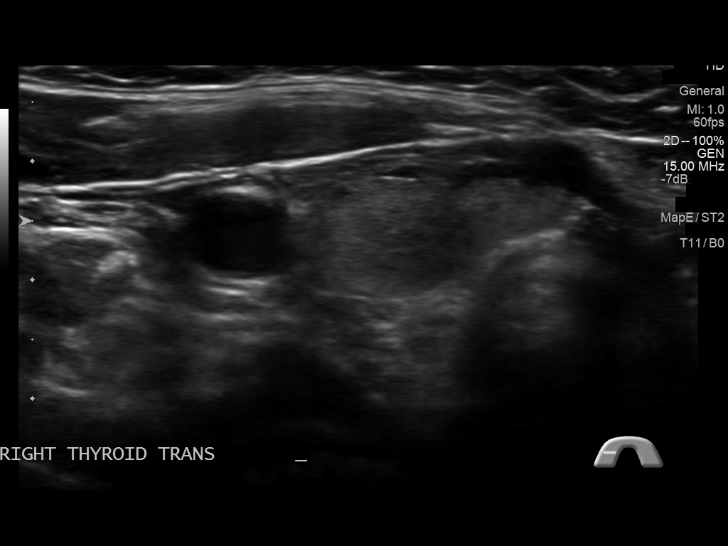
[im 28/74]
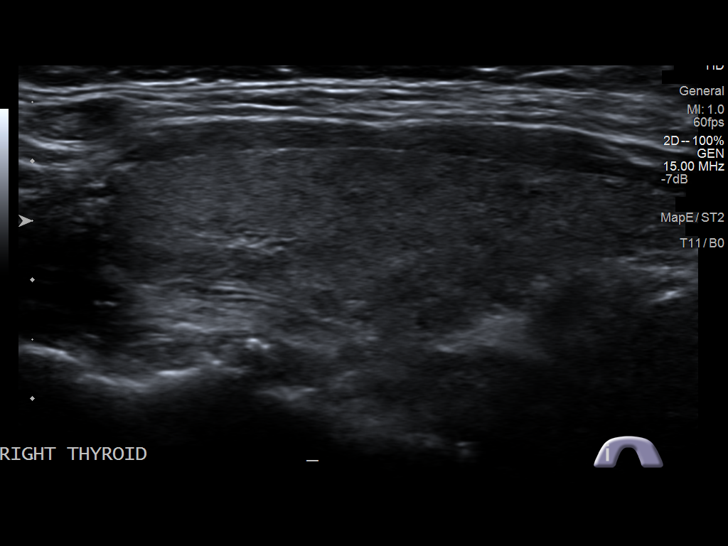
[im 34/74]
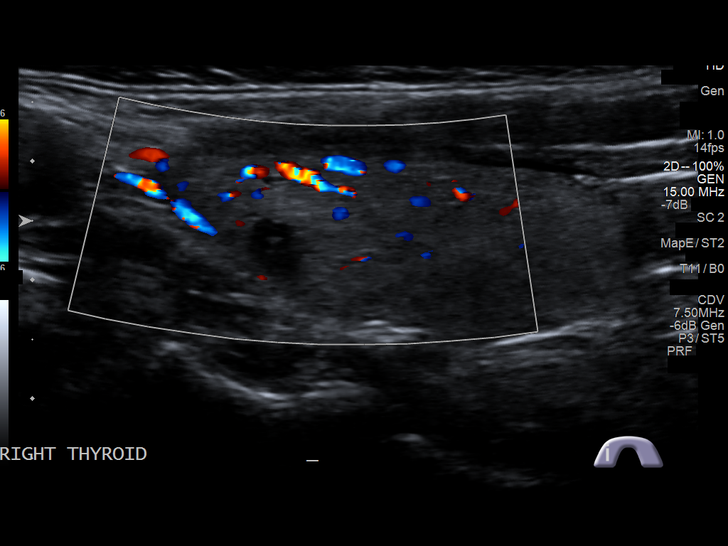
[im 40/74]
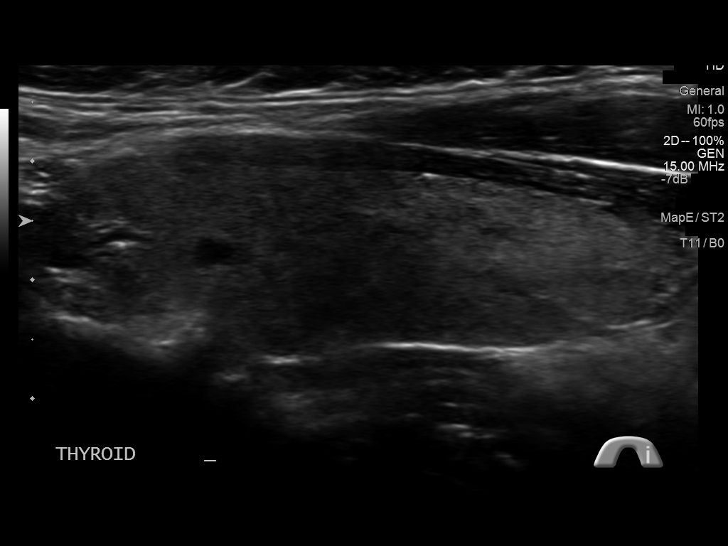
[im 46/74]
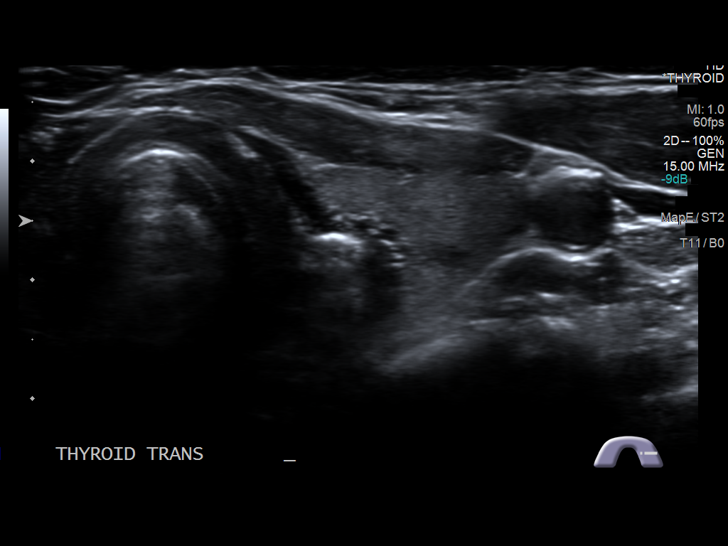
[im 49/74]
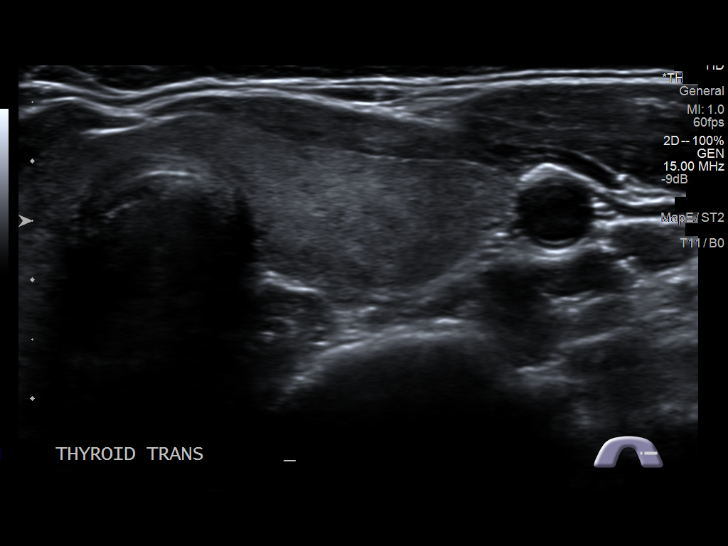
[im 55/74]
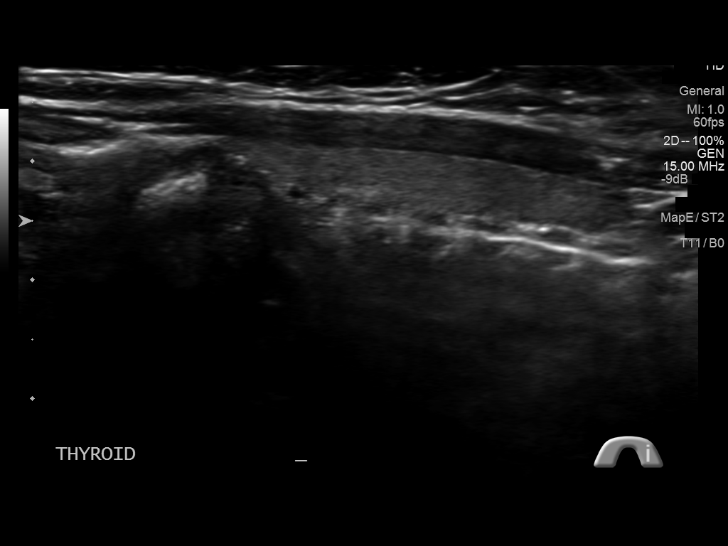
[im 61/74]
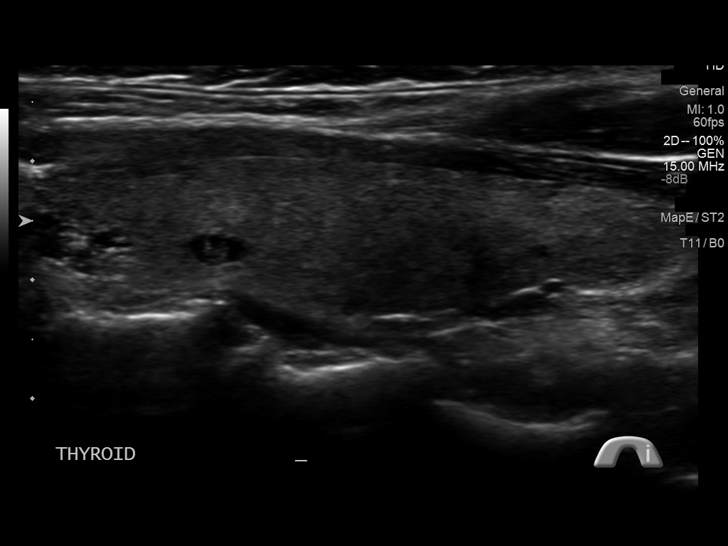
[im 67/74]
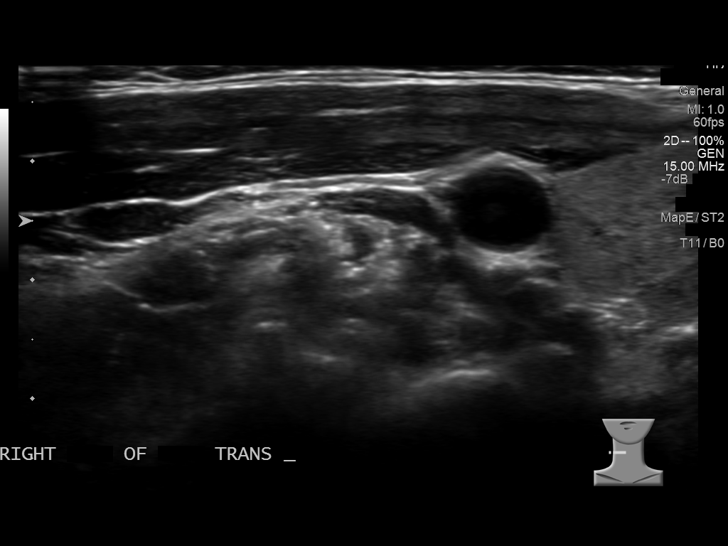
[im 74/74]
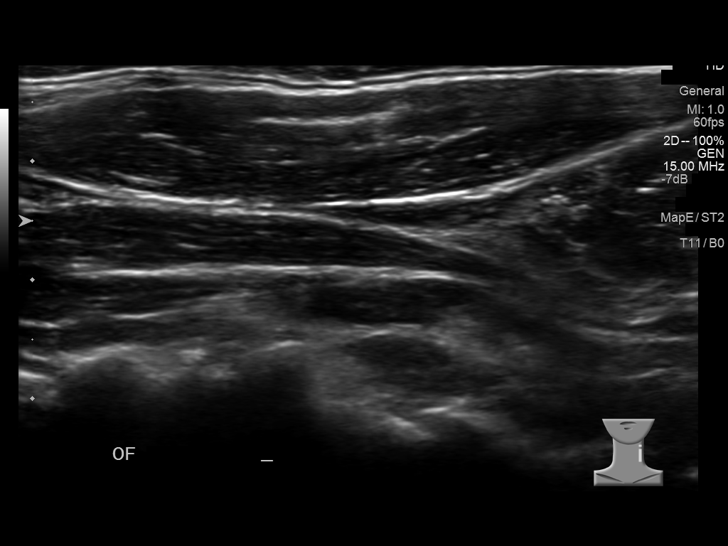

[14 of 25 positions shown; findings below may reference images not displayed]

FINDINGS: Right thyroid lobe

Measurements: 5.3 x 1.7 x 2.2 cm. 6 mm mid lobe complex nodule.
Heterogeneous tissue.

Left thyroid lobe

Measurements: 5.3 x 1.4 x 2.1 cm. 5 mm mid lobe complex nodule.
Heterogeneous tissue.

Isthmus

Thickness: 3 mm.  No nodules visualized.

Lymphadenopathy

None visualized.
IMPRESSION: Small bilateral thyroid nodules. Findings do not meet current SRU
consensus criteria for biopsy. Follow-up by clinical exam is
recommended. If patient has known risk factors for thyroid
carcinoma, consider follow-up ultrasound in 12 months. If patient is
clinically hyperthyroid, consider nuclear medicine thyroid uptake
and scan.Reference: Management of Thyroid Nodules Detected at US:
Society of Radiologists in Ultrasound Consensus Conference

## 2016-06-17 IMAGING — CR DG ABDOMEN 2V
1 series · 2 of 2 positions shown · non-contrast
Comparison: None.

CLINICAL DATA: 49-year-old female with spider bite discovered
yesterday. Abdominal pain. Initial encounter.

EXAM:
ABDOMEN - 2 VIEW

[Series 1: w abdomen upright · 0.14mm/px · 2 of 2 slices shown]
[im 1/2]
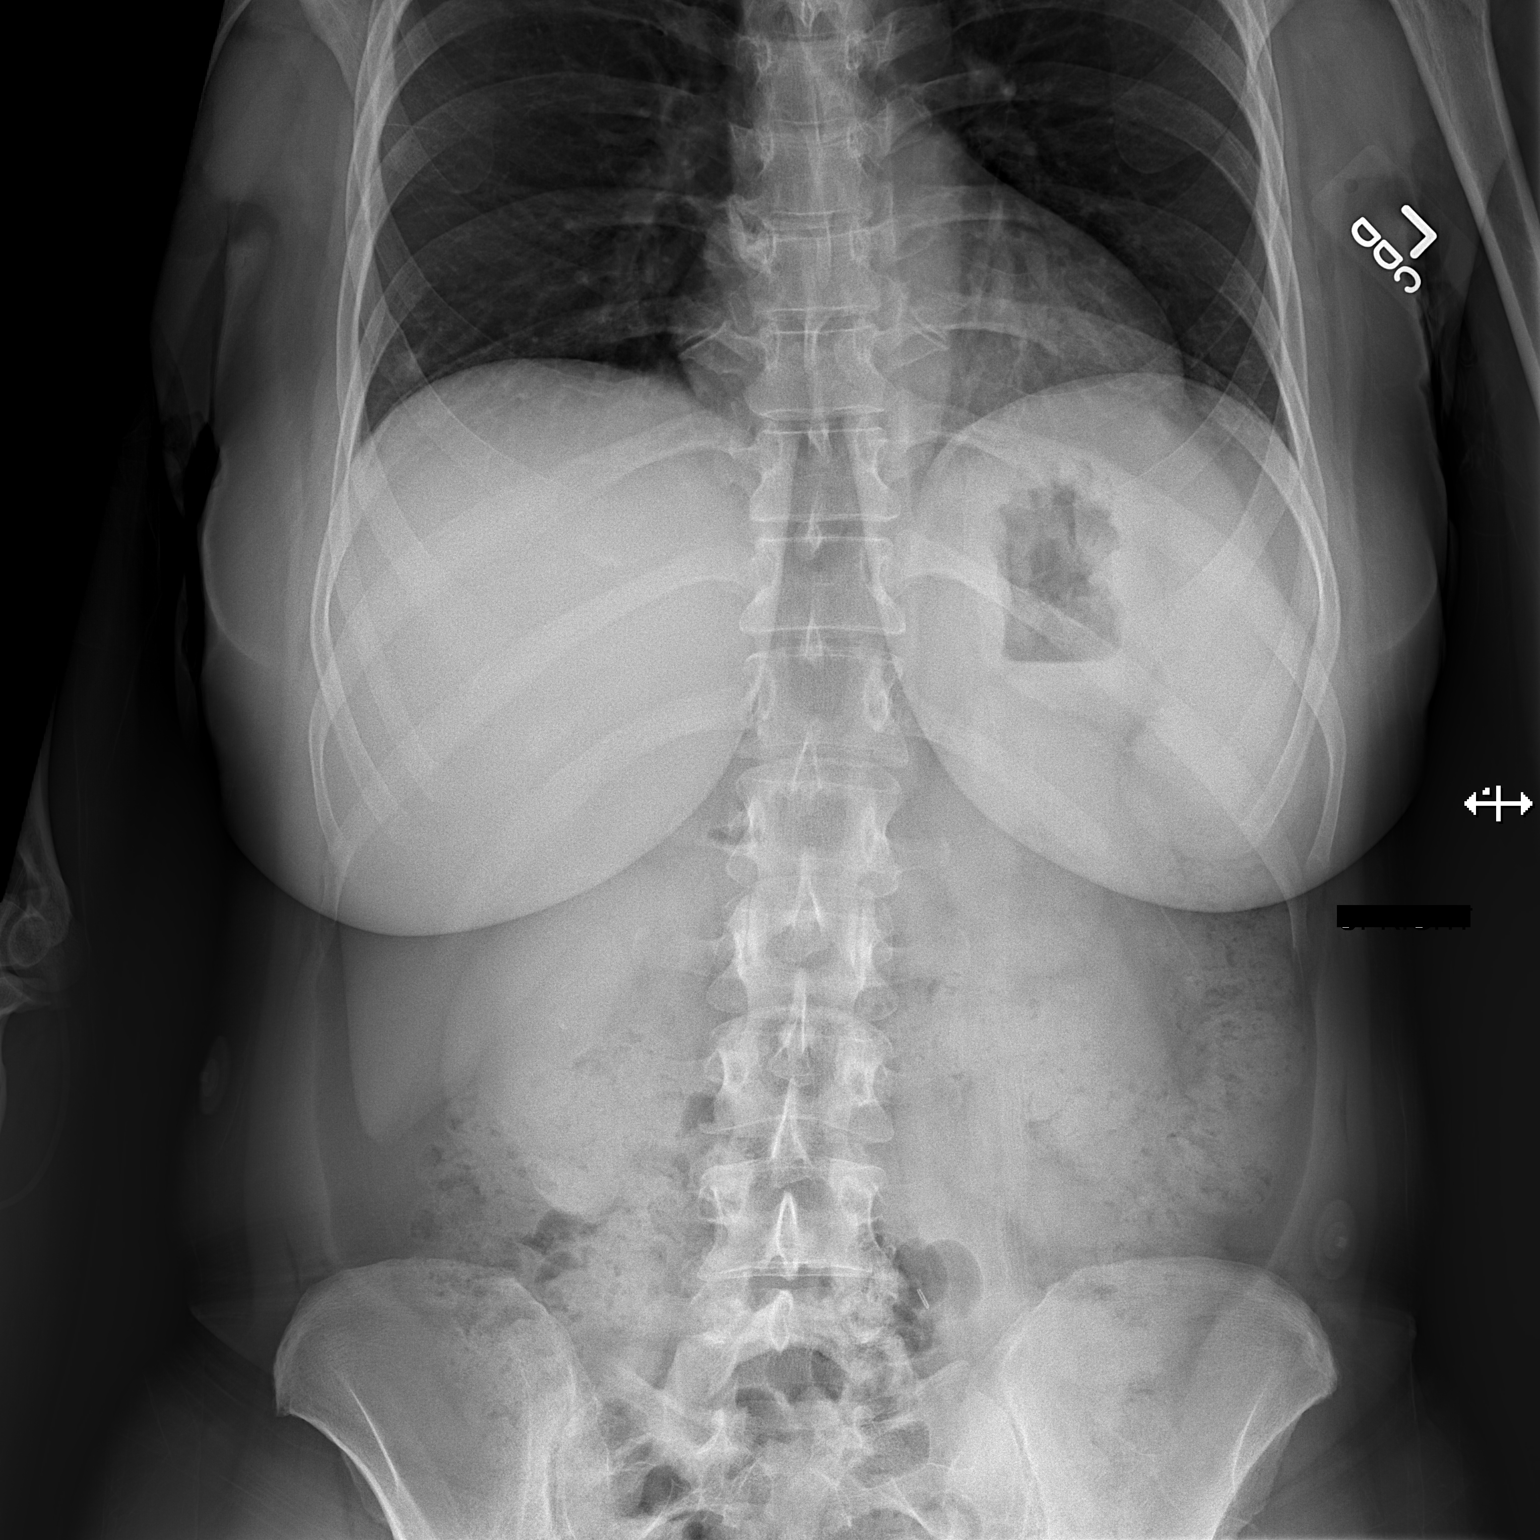
[im 2/2]
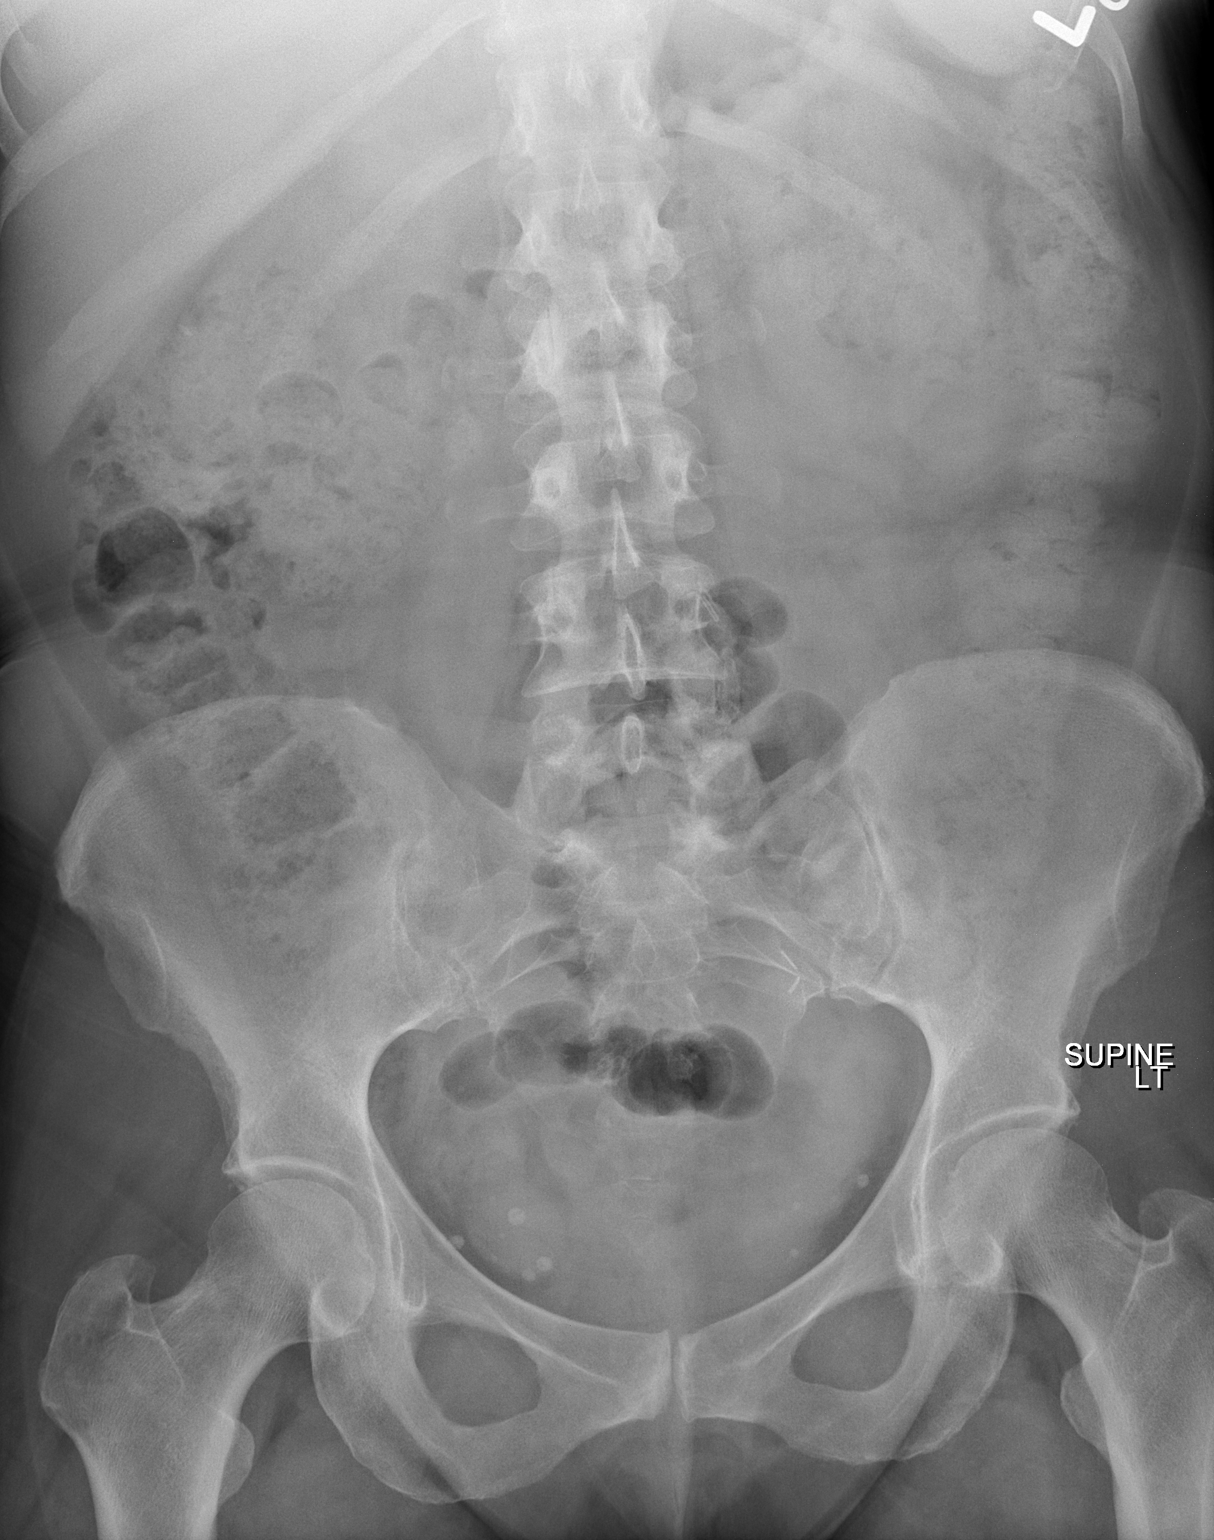

[2 of 2 positions shown; findings below may reference images not displayed]

FINDINGS: Upright and supine views of the abdomen. Small area of patchy
opacity at the right lung base (arrow), but otherwise negative lung
bases. No evidence of pleural effusion. No pneumoperitoneum. Non
obstructed bowel gas pattern. Small bilateral pelvic phleboliths.
Abdominal and pelvic visceral contours are within normal limits. No
acute osseous abnormality identified.
IMPRESSION: 1.  Normal bowel gas pattern, no free air.
2. Small area of opacity at the right lung base is nonspecific and
might reflect scarring in the absence of acute pulmonary symptoms.
If the patient is asymptomatic, follow up PA and lateral chest
radiographs in 8-12 weeks to evaluate stability/persistence.
